# Patient Record
Sex: Male | Born: 1965 | Race: White | Hispanic: No | Marital: Single | Smoking: Never smoker
Health system: Southern US, Community
[De-identification: ages and names within clinical notes are randomized; demographics above are authoritative.]

## PROBLEM LIST (undated history)

## (undated) DIAGNOSIS — G4730 Sleep apnea, unspecified: Secondary | ICD-10-CM

## (undated) DIAGNOSIS — I10 Essential (primary) hypertension: Secondary | ICD-10-CM

## (undated) DIAGNOSIS — R011 Cardiac murmur, unspecified: Secondary | ICD-10-CM

## (undated) DIAGNOSIS — M1990 Unspecified osteoarthritis, unspecified site: Secondary | ICD-10-CM

## (undated) HISTORY — PX: HIP ARTHROPLASTY: SHX981

## (undated) HISTORY — DX: Unspecified osteoarthritis, unspecified site: M19.90

## (undated) HISTORY — DX: Cardiac murmur, unspecified: R01.1

## (undated) HISTORY — DX: Sleep apnea, unspecified: G47.30

---

## 1984-08-15 DIAGNOSIS — R011 Cardiac murmur, unspecified: Secondary | ICD-10-CM

## 1984-08-15 HISTORY — DX: Cardiac murmur, unspecified: R01.1

## 1984-08-15 HISTORY — PX: WISDOM TOOTH EXTRACTION: SHX21

## 1998-02-11 ENCOUNTER — Emergency Department (HOSPITAL_COMMUNITY): Admission: EM | Admit: 1998-02-11 | Discharge: 1998-02-11 | Payer: Self-pay | Admitting: Psychiatry

## 1998-03-27 ENCOUNTER — Ambulatory Visit (HOSPITAL_COMMUNITY): Admission: RE | Admit: 1998-03-27 | Discharge: 1998-03-27 | Payer: Self-pay | Admitting: Family Medicine

## 2002-11-08 ENCOUNTER — Emergency Department (HOSPITAL_COMMUNITY): Admission: EM | Admit: 2002-11-08 | Discharge: 2002-11-08 | Payer: Self-pay | Admitting: Emergency Medicine

## 2005-09-27 ENCOUNTER — Ambulatory Visit (HOSPITAL_BASED_OUTPATIENT_CLINIC_OR_DEPARTMENT_OTHER): Admission: RE | Admit: 2005-09-27 | Discharge: 2005-09-27 | Payer: Self-pay | Admitting: Family Medicine

## 2005-10-02 ENCOUNTER — Ambulatory Visit: Payer: Self-pay | Admitting: Internal Medicine

## 2005-10-26 ENCOUNTER — Ambulatory Visit (HOSPITAL_BASED_OUTPATIENT_CLINIC_OR_DEPARTMENT_OTHER): Admission: RE | Admit: 2005-10-26 | Discharge: 2005-10-26 | Payer: Self-pay | Admitting: Family Medicine

## 2005-11-06 ENCOUNTER — Ambulatory Visit: Payer: Self-pay | Admitting: Internal Medicine

## 2006-05-02 ENCOUNTER — Emergency Department (HOSPITAL_COMMUNITY): Admission: EM | Admit: 2006-05-02 | Discharge: 2006-05-02 | Payer: Self-pay | Admitting: Emergency Medicine

## 2008-09-13 ENCOUNTER — Emergency Department (HOSPITAL_COMMUNITY): Admission: EM | Admit: 2008-09-13 | Discharge: 2008-09-13 | Payer: Self-pay | Admitting: Emergency Medicine

## 2010-05-05 ENCOUNTER — Ambulatory Visit (HOSPITAL_COMMUNITY): Admission: RE | Admit: 2010-05-05 | Discharge: 2010-05-05 | Payer: Self-pay | Admitting: Family Medicine

## 2010-12-31 NOTE — Procedures (Signed)
NAME:  MALE, Corey NO.:  [id number]   MEDICAL RECORD NO.:  [id number]          PATIENT TYPE:  OUT   LOCATION:  SLEEP CENTER                 FACILITY:  [facility]   PHYSICIAN:  Clinton D. Maple Hudson, M.D. DATE OF BIRTH:  Jul 17, 1966   DATE OF STUDY:                              NOCTURNAL POLYSOMNOGRAM   REFERRING PHYSICIAN:  Dr. Noberto Retort.   DATE OF STUDY:  September 27, 2005.   INDICATION FOR STUDY:  Hypersomnia with sleep apnea.   EPWORTH SLEEPINESS SCORE:  4/24.   BMI:  33.8.   WEIGHT:  250 pounds.   HOME MEDICATIONS:  Flonase.   NECK SIZE:  19 inches.   SLEEP ARCHITECTURE:  Total sleep time 303 minutes with sleep efficiency 82%.  Stage I was 7%, stage II 60%, stages III and IV 17%, REM 16% of total sleep  time. Sleep latency 22 minutes, REM latency 174 minutes, awake after sleep  onset 50 minutes, arousal index markedly increased at 72 per hour indicating  severe sleep fragmentation. Sustained sleep onset was not achieved until 1  a.m.   RESPIRATORY DATA:  Apnea/hypopnea index (AHI, RDI) 95.4 obstructive events  per hour indicating severe obstructive sleep apnea/hypopnea syndrome. This  included 2 central apneas, 387 obstructive apneas, 17 mixed apneas and 76  hypopneas. Events were not positional. REM AHI 98.7 per hour. Split protocol  C-PAP titration could not be performed on the study night because of his  delayed sustained sleep onset, leaving insufficient residual time by  protocol.   OXYGEN DATA:  Very loud snoring with oxygen desaturation to a nadir of 54%.  Mean oxygen saturation through the study was 89% on room air.   CARDIAC DATA:  Normal sinus rhythm.   MOVEMENT/PARASOMNIA:  Occasional leg jerk, insignificant. Bathroom x1.   IMPRESSION/RECOMMENDATIONS:  1.  Severe obstructive sleep apnea/hypopnea syndrome, AHI 95.4 per hour with      not positional events, fragmented sleep, loud snoring and oxygen      desaturation to 54%.  2.   Delayed sleep onset prevented split protocol C-PAP titration. Consider      return for C-PAP titration or evaluate sooner for alternative therapies      as appropriate.  3.  Low mean oxygen saturation of 89% on room air may reflect the frequent      apneas. Is there underlying cardiopulmonary disease?      Clinton D. Maple Hudson, M.D.  Diplomate, Biomedical engineer of Sleep Medicine  Electronically Signed     CDY/MEDQ  D:  10/02/2005 11:01:29  T:  10/02/2005 22:15:04  Job:  [id number]

## 2010-12-31 NOTE — Procedures (Signed)
NAME:  Corey Key, Corey Key NO.:  [id number]   MEDICAL RECORD NO.:  [id number]          PATIENT TYPE:  OUT   LOCATION:  SLEEP CENTER                 FACILITY:  [facility]   PHYSICIAN:  Clinton D. Maple Hudson, M.D. DATE OF BIRTH:  [birth year]   DATE OF STUDY:  10/26/2005                              NOCTURNAL POLYSOMNOGRAM   REFERRING PHYSICIAN:  Dr. Nolene Ebbs IV.   DATE OF STUDY:  October 26, 2005.   INDICATION FOR STUDY:  Hypersomnia with sleep apnea.   EPWORTH SLEEPINESS SCORE:  4/24.   BMI:  33.8.   WEIGHT:  250 pounds.   HOME MEDICATIONS:  Flonase.   A baseline diagnostic NPSG on September 27, 2005 reported an AHI of 95.4 per  hour.  C-PAP  titration is requested.   SLEEP ARCHITECTURE:  Total sleep time 399 minutes with sleep efficiency 85%.  Stage I was 3%, stage II 62%, stages III and IV absent, REM 35% of total  sleep time.  Sleep latency 7 minutes, REM latency 108 minutes, awake after  sleep onset 61 minutes, arousal index 8.4.  No bedtime medication was  reported.   RESPIRATORY DATA:  C-PAP titration protocol:  C-PAP was titrated to 16 CWP,  AHI 0 per hour.  A medium ComfortGel mask was used with heated humidifier.   OXYGEN DATA:  Moderate snoring was suppressed at final C-PAP pressure.  Oxygenation was held at 95-98% on room air with C-PAP.   CARDIAC DATA:  Normal sinus rhythm.   MOVEMENT/PARASOMNIA:  Occasional leg jerk, insignificant.   IMPRESSION/RECOMMENDATIONS:  1.  Successful C-PAP titration to 16 CWP, AHI 0 per hour.  A medium      ComfortGel mask was used with heated      humidifier.  2.  Baseline diagnostic NPSG on September 27, 2005 had reported an AHI of      95.4 per hour.      Clinton D. Maple Hudson, M.D.  Diplomate, Biomedical engineer of Sleep Medicine  Electronically Signed     CDY/MEDQ  D:  11/06/2005 11:13:40  T:  11/08/2005 00:16:56  Job:  [id number]

## 2012-12-06 DIAGNOSIS — I10 Essential (primary) hypertension: Secondary | ICD-10-CM | POA: Insufficient documentation

## 2013-07-11 ENCOUNTER — Emergency Department (HOSPITAL_COMMUNITY): Payer: Worker's Compensation

## 2013-07-11 ENCOUNTER — Encounter (HOSPITAL_COMMUNITY): Payer: Self-pay | Admitting: Emergency Medicine

## 2013-07-11 ENCOUNTER — Emergency Department (HOSPITAL_COMMUNITY)
Admission: EM | Admit: 2013-07-11 | Discharge: 2013-07-11 | Disposition: A | Discharge status: Home / Self Care | Payer: Worker's Compensation | Attending: Emergency Medicine | Admitting: Emergency Medicine

## 2013-07-11 DIAGNOSIS — M545 Low back pain, unspecified: Secondary | ICD-10-CM

## 2013-07-11 DIAGNOSIS — S0990XA Unspecified injury of head, initial encounter: Secondary | ICD-10-CM | POA: Diagnosis not present

## 2013-07-11 DIAGNOSIS — S0993XA Unspecified injury of face, initial encounter: Secondary | ICD-10-CM | POA: Diagnosis not present

## 2013-07-11 DIAGNOSIS — R519 Headache, unspecified: Secondary | ICD-10-CM

## 2013-07-11 DIAGNOSIS — I10 Essential (primary) hypertension: Secondary | ICD-10-CM | POA: Insufficient documentation

## 2013-07-11 DIAGNOSIS — Z880 Allergy status to penicillin: Secondary | ICD-10-CM | POA: Insufficient documentation

## 2013-07-11 DIAGNOSIS — IMO0002 Reserved for concepts with insufficient information to code with codable children: Secondary | ICD-10-CM | POA: Insufficient documentation

## 2013-07-11 DIAGNOSIS — R269 Unspecified abnormalities of gait and mobility: Secondary | ICD-10-CM | POA: Diagnosis not present

## 2013-07-11 DIAGNOSIS — R110 Nausea: Secondary | ICD-10-CM | POA: Insufficient documentation

## 2013-07-11 DIAGNOSIS — S1093XA Contusion of unspecified part of neck, initial encounter: Secondary | ICD-10-CM

## 2013-07-11 DIAGNOSIS — S298XXA Other specified injuries of thorax, initial encounter: Secondary | ICD-10-CM | POA: Insufficient documentation

## 2013-07-11 DIAGNOSIS — S0003XA Contusion of scalp, initial encounter: Secondary | ICD-10-CM | POA: Diagnosis not present

## 2013-07-11 DIAGNOSIS — R0781 Pleurodynia: Secondary | ICD-10-CM

## 2013-07-11 DIAGNOSIS — M542 Cervicalgia: Secondary | ICD-10-CM

## 2013-07-11 HISTORY — DX: Essential (primary) hypertension: I10

## 2013-07-11 MED ORDER — OXYCODONE-ACETAMINOPHEN 5-325 MG PO TABS: 1.0000 | ORAL_TABLET | ORAL | Status: DC | PRN

## 2013-07-11 MED ORDER — OXYCODONE-ACETAMINOPHEN 5-325 MG PO TABS
2.0000 | ORAL_TABLET | Freq: Once | ORAL | Status: AC
  Administered 2013-07-11: 2 via ORAL
  Filled 2013-07-11: qty 2

## 2013-07-11 NOTE — ED Notes (Signed)
Patient has ride home with wife 
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
## 2013-07-11 NOTE — ED Notes (Signed)
Dried blood noted on inside of mouth and around lips.
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
## 2013-07-15 NOTE — ED Provider Notes (Signed)
Medical screening examination/treatment/procedure(s) were performed by non-physician practitioner and as supervising physician I was immediately available for consultation/collaboration.  EKG Interpretation   None         Charles B. Bernette Mayers, MD 07/15/13 [phone]

## 2015-01-26 ENCOUNTER — Emergency Department (HOSPITAL_COMMUNITY): Payer: Worker's Compensation

## 2015-01-26 ENCOUNTER — Emergency Department (HOSPITAL_COMMUNITY)
Admission: EM | Admit: 2015-01-26 | Discharge: 2015-01-26 | Disposition: A | Discharge status: Home / Self Care | Payer: Worker's Compensation | Attending: Emergency Medicine | Admitting: Emergency Medicine

## 2015-01-26 ENCOUNTER — Encounter (HOSPITAL_COMMUNITY): Payer: Self-pay | Admitting: Emergency Medicine

## 2015-01-26 DIAGNOSIS — Y990 Civilian activity done for income or pay: Secondary | ICD-10-CM | POA: Diagnosis not present

## 2015-01-26 DIAGNOSIS — W228XXA Striking against or struck by other objects, initial encounter: Secondary | ICD-10-CM | POA: Insufficient documentation

## 2015-01-26 DIAGNOSIS — I10 Essential (primary) hypertension: Secondary | ICD-10-CM | POA: Insufficient documentation

## 2015-01-26 DIAGNOSIS — S6992XA Unspecified injury of left wrist, hand and finger(s), initial encounter: Secondary | ICD-10-CM | POA: Diagnosis present

## 2015-01-26 DIAGNOSIS — Z880 Allergy status to penicillin: Secondary | ICD-10-CM | POA: Diagnosis not present

## 2015-01-26 DIAGNOSIS — S63502A Unspecified sprain of left wrist, initial encounter: Secondary | ICD-10-CM | POA: Insufficient documentation

## 2015-01-26 DIAGNOSIS — Y9389 Activity, other specified: Secondary | ICD-10-CM | POA: Insufficient documentation

## 2015-01-26 DIAGNOSIS — Y9289 Other specified places as the place of occurrence of the external cause: Secondary | ICD-10-CM | POA: Diagnosis not present

## 2015-01-26 MED ORDER — NAPROXEN 250 MG PO TABS: 250.0000 mg | ORAL_TABLET | Freq: Two times a day (BID) | ORAL | Status: DC

## 2015-01-26 MED ORDER — IBUPROFEN 400 MG PO TABS
800.0000 mg | ORAL_TABLET | Freq: Once | ORAL | Status: AC
  Administered 2015-01-26: 800 mg via ORAL
  Filled 2015-01-26: qty 2

## 2015-01-26 NOTE — ED Notes (Signed)
Pt states this is a worker's comp claim, but he verified with his employer that he did not need a drug screen.

## 2015-01-26 NOTE — ED Notes (Signed)
Pt verbalizes understanding of d/c instructions and denies any further needs at this time. 
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
## 2015-01-26 NOTE — Discharge Instructions (Signed)
Wrist Pain Wrist injuries are frequent in adults and children. A sprain is an injury to the ligaments that hold your bones together. A strain is an injury to muscle or muscle cord-like structures (tendons) from stretching or pulling. Generally, when wrists are moderately tender to touch following a fall or injury, a break in the bone (fracture) may be present. Most wrist sprains or strains are better in 3 to 5 days, but complete healing may take several weeks. HOME CARE INSTRUCTIONS   Put ice on the injured area.  Put ice in a plastic bag.  Place a towel between your skin and the bag.  Leave the ice on for 15-20 minutes, 3-4 times a day, for the first 2 days, or as directed by your health care provider.  Keep your arm raised above the level of your heart whenever possible to reduce swelling and pain.  Rest the injured area for at least 48 hours or as directed by your health care provider.  If a splint or elastic bandage has been applied, use it for as long as directed by your health care provider or until seen by a health care provider for a follow-up exam.  Only take over-the-counter or prescription medicines for pain, discomfort, or fever as directed by your health care provider.  Keep all follow-up appointments. You may need to follow up with a specialist or have follow-up X-rays. Improvement in pain level is not a guarantee that you did not fracture a bone in your wrist. The only way to determine whether or not you have a broken bone is by X-ray. SEEK IMMEDIATE MEDICAL CARE IF:   Your fingers are swollen, very red, white, or cold and blue.  Your fingers are numb or tingling.  You have increasing pain.  You have difficulty moving your fingers. MAKE SURE YOU:   Understand these instructions.  Will watch your condition.  Will get help right away if you are not doing well or get worse. Document Released: 05/11/2005 Document Revised: 08/06/2013 Document Reviewed:  09/22/2010 [facility] Patient Information 2015 [place], [place]. This information is not intended to replace advice given to you by your health care provider. Make sure you discuss any questions you have with your health care provider. Wrist Splint A wrist splint is a brace that holds your wrist in a fixed position. It can be used to stabilize your wrist so that broken bones and sprains can heal faster, with less pain. It can also help to relieve pressure on the nerve that runs down the middle of your arm (median nerve). Splints are available in drugstores without a prescription. They are also available by prescription from orthopedic and medical supply stores. Custom splints made from lightweight materials can be made by physical or occupational therapist. [place] your splint as instructed by your caregiver. It may be worn while you sleep.  Your caregiver may instruct you how to perform certain exercises at home. These exercises help maintain muscle strength in your hand and wrist. They also help to maintain motion in your fingers. SEEK MEDICAL CARE IF:  You start to lose feeling in your hand or fingers.  Your skin or fingernails turn blue or gray, or they feel cold. MAKE SURE YOU:   Understand these instructions.  Will watch your condition.  Will get help right away if you are not doing well or get worse. Document Released: 07/14/2006 Document Revised: 10/24/2011 Document Reviewed: 11/12/2013 [facility] Patient Information 2015 [place], [place]. This information is  not intended to replace advice given to you by your health care provider. Make sure you discuss any questions you have with your health care provider.

## 2015-01-26 NOTE — ED Notes (Signed)
Pt. 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

## 2015-12-30 ENCOUNTER — Encounter: Payer: Self-pay | Admitting: Gastroenterology

## 2016-01-29 ENCOUNTER — Ambulatory Visit (AMBULATORY_SURGERY_CENTER): Payer: Self-pay

## 2016-01-29 VITALS — Ht 72.0 in | Wt 272.6 lb

## 2016-01-29 DIAGNOSIS — Z83719 Family history of colon polyps, unspecified: Secondary | ICD-10-CM

## 2016-01-29 DIAGNOSIS — Z8371 Family history of colonic polyps: Secondary | ICD-10-CM

## 2016-01-29 MED ORDER — SUPREP BOWEL PREP KIT 17.5-3.13-1.6 GM/177ML PO SOLN: 1.0000 | Freq: Once | ORAL | Status: DC

## 2016-01-29 NOTE — Progress Notes (Signed)
No allergies to eggs or soy No diet meds No home oxygen but does use CPAP No past problems with anesthesia  Has email and internet; registered for emmi

## 2016-02-09 ENCOUNTER — Encounter (HOSPITAL_BASED_OUTPATIENT_CLINIC_OR_DEPARTMENT_OTHER): Payer: Self-pay

## 2016-02-09 ENCOUNTER — Emergency Department (HOSPITAL_BASED_OUTPATIENT_CLINIC_OR_DEPARTMENT_OTHER): Payer: Worker's Compensation

## 2016-02-09 ENCOUNTER — Emergency Department (HOSPITAL_BASED_OUTPATIENT_CLINIC_OR_DEPARTMENT_OTHER)
Admission: EM | Admit: 2016-02-09 | Discharge: 2016-02-09 | Disposition: A | Discharge status: Home / Self Care | Payer: Worker's Compensation | Attending: Emergency Medicine | Admitting: Emergency Medicine

## 2016-02-09 DIAGNOSIS — I10 Essential (primary) hypertension: Secondary | ICD-10-CM | POA: Diagnosis not present

## 2016-02-09 DIAGNOSIS — Y990 Civilian activity done for income or pay: Secondary | ICD-10-CM | POA: Insufficient documentation

## 2016-02-09 DIAGNOSIS — M79641 Pain in right hand: Secondary | ICD-10-CM

## 2016-02-09 DIAGNOSIS — S6991XA Unspecified injury of right wrist, hand and finger(s), initial encounter: Secondary | ICD-10-CM | POA: Insufficient documentation

## 2016-02-09 DIAGNOSIS — Y9389 Activity, other specified: Secondary | ICD-10-CM | POA: Diagnosis not present

## 2016-02-09 DIAGNOSIS — M1990 Unspecified osteoarthritis, unspecified site: Secondary | ICD-10-CM | POA: Insufficient documentation

## 2016-02-09 DIAGNOSIS — Y9289 Other specified places as the place of occurrence of the external cause: Secondary | ICD-10-CM | POA: Diagnosis not present

## 2016-02-09 MED ORDER — IBUPROFEN 800 MG PO TABS
800.0000 mg | ORAL_TABLET | Freq: Once | ORAL | Status: AC
  Administered 2016-02-09: 800 mg via ORAL
  Filled 2016-02-09: qty 1

## 2016-02-09 MED ORDER — IBUPROFEN 800 MG PO TABS: 800.0000 mg | ORAL_TABLET | Freq: Three times a day (TID) | ORAL | Status: AC

## 2016-02-09 NOTE — ED Provider Notes (Signed)
CSN: [id number]     Arrival date & time 02/09/16  [id number] History   First MD Initiated Contact with Patient 02/09/16 2146     Chief Complaint  Patient presents with  . Hand Injury    (Consider location/radiation/quality/duration/timing/severity/associated sxs/prior Treatment) Patient is a 50 y.o. male presenting with hand injury. The history is provided by the patient and medical records. No language interpreter was used.  Hand Injury    Corey Key is a 50 y.o. male  who presents to the Emergency Department complaining of acute onset of right hand pain near middle finger knuckle since an injury at work today. 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 Patient is unsure of the mechanism of injury but believes his finger bent forward oddly. Admits to swelling. Denies numbness, tingling. Pain worse with flexion and extension of fingers. No medications taken PTA fo symptoms.    Past Medical History  Diagnosis Date  . Hypertension   . Arthritis     left hip  . Heart murmur 1986  . Sleep apnea     CPAP   Past Surgical History  Procedure Laterality Date  . Wisdom tooth extraction  1986   Family History  Problem Relation Age of Onset  . Colon polyps Father   . Prostate cancer Father   . Colon cancer Neg Hx    Social History  Substance Use Topics  . Smoking status: Never Smoker   . Smokeless tobacco: Never Used  . Alcohol Use: 0.0 oz/week    0 Standard drinks or equivalent per week     Comment: rarely    Review of Systems  Musculoskeletal: Positive for arthralgias.  Skin: Negative for color change.  Neurological: Negative for numbness.      Allergies  Penicillins  Home Medications   Prior to Admission medications   Medication Sig Start Date End Date Taking? Authorizing Provider  ibuprofen (ADVIL,MOTRIN) 800 MG tablet Take 1 tablet (800 mg total) by mouth 3 (three) times daily. 02/09/16   Linnae Rasool Pilcher Jhonatan Lomeli,  PA-C   BP 141/98 mmHg  Pulse 94  Resp 16  SpO2 97% Physical Exam  Constitutional: He is oriented to person, place, and time. He appears well-developed and well-nourished.  Alert and in no acute distress  HENT:  Head: Normocephalic and atraumatic.  Cardiovascular: Normal rate, regular rhythm and normal heart sounds.  Exam reveals no gallop and no friction rub.   No murmur heard. Pulmonary/Chest: Effort normal and breath sounds normal. No respiratory distress. He has no wheezes. He has no rales.  Musculoskeletal: He exhibits no edema.  Right hand with no gross deformity noted. Patient has full ROM. TTP over middle finger knuckle area with mild swelling appreciated. No erythema or warmth overlaying the joint.The patient has normal sensation and motor function in the median, ulnar, and radial nerve distributions. There is no anatomic snuff box tenderness. The patient has normal active and passive range of motion of their digits. 2+ radial pulse.  Neurological: He is alert and oriented to person, place, and time.  Skin: Skin is warm and dry.  Nursing note and vitals reviewed.   ED Course  Procedures (including critical care time) Labs Review Labs Reviewed - No data to display  Imaging Review Dg Hand Complete Right  02/09/2016  CLINICAL DATA:  51 year old male with injury to right hand EXAM: RIGHT HAND - COMPLETE 3+ VIEW COMPARISON:  None. FINDINGS: There is no  evidence of fracture or dislocation. There is no evidence of arthropathy or other focal bone abnormality. Soft tissues are unremarkable. IMPRESSION: Negative. Electronically Signed   By: Anner Crete M.D.   On: 02/09/2016 19:27   I have personally reviewed and evaluated these images and lab results as part of my medical decision-making.   EKG Interpretation None      MDM   Final diagnoses:  Hand pain, right   Corey Key presents to ED for right hand pain after injury today. X-rays were obtained which were negative.  On exam, RUE is NVI with no open wounds. Pain is of middle finger knuckle area. Finger splint applied by tech for comfort. Symptomatic home care instructions discussed. Follow-up in one week if no improvement in symptoms. Return precautions discussed and all questions answered.   [facility] Murlene Revell, PA-C 02/09/16 [place], DO 02/09/16 [phone]

## 2016-02-09 NOTE — ED Notes (Signed)
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

## 2016-02-09 NOTE — Discharge Instructions (Signed)
1. Medications: Ibuprofen as needed for pain, continue usual home medications 2. Treatment: Ice affected area as needed for pain.  3. Follow Up: Please follow up with your primary doctor or orthopedist in one week if symptoms not improving.  Please return to the ER for new or worsening symptoms, any additional concerns.   COLD THERAPY DIRECTIONS:  Ice or gel packs can be used to reduce both pain and swelling. Ice is the most helpful within the first 24 to 48 hours after an injury or flareup from overusing a muscle or joint.  Ice is effective, has very few side effects, and is safe for most people to use.   If you expose your skin to cold temperatures for too long or without the proper protection, you can damage your skin or nerves. Watch for signs of skin damage due to cold.   HOME CARE INSTRUCTIONS  Follow these tips to use ice and cold packs safely.  Place a dry or damp towel between the ice and skin. A damp towel will cool the skin more quickly, so you may need to shorten the time that the ice is used.  For a more rapid response, add gentle compression to the ice.  Ice for no more than 10 to 20 minutes at a time. The bonier the area you are icing, the less time it will take to get the benefits of ice.  Check your skin after 5 minutes to make sure there are no signs of a poor response to cold or skin damage.  Rest 20 minutes or more in between uses.  Once your skin is numb, you can end your treatment. You can test numbness by very lightly touching your skin. The touch should be so light that you do not see the skin dimple from the pressure of your fingertip. When using ice, most people will feel these normal sensations in this order: cold, burning, aching, and numbness.

## 2016-02-16 ENCOUNTER — Encounter: Payer: Self-pay | Admitting: Gastroenterology

## 2016-02-19 ENCOUNTER — Encounter: Payer: Self-pay | Admitting: Gastroenterology

## 2016-03-01 ENCOUNTER — Encounter: Payer: Self-pay | Admitting: Gastroenterology

## 2016-03-01 ENCOUNTER — Ambulatory Visit (AMBULATORY_SURGERY_CENTER): Payer: 59 | Admitting: Gastroenterology

## 2016-03-01 VITALS — BP 112/65 | HR 77 | Temp 98.6°F | Resp 14 | Ht 72.0 in | Wt 272.0 lb

## 2016-03-01 DIAGNOSIS — Z1211 Encounter for screening for malignant neoplasm of colon: Secondary | ICD-10-CM

## 2016-03-01 DIAGNOSIS — K621 Rectal polyp: Secondary | ICD-10-CM | POA: Diagnosis not present

## 2016-03-01 DIAGNOSIS — D128 Benign neoplasm of rectum: Secondary | ICD-10-CM

## 2016-03-01 DIAGNOSIS — Z8371 Family history of colonic polyps: Secondary | ICD-10-CM

## 2016-03-01 MED ORDER — SODIUM CHLORIDE 0.9 % IV SOLN: 500.0000 mL | INTRAVENOUS | Status: DC

## 2016-03-01 NOTE — Progress Notes (Signed)
No problems noted in the recovery room. maw 

## 2016-03-01 NOTE — Op Note (Signed)
[place] Patient Name: Corey Key Procedure Date: 03/01/2016 1:52 PM MRN: [id number] Endoscopist: Remo Lipps P. Havery Moros , MD Age: 50 Referring MD:  Date of Birth: 12-May-1966 Gender: Male Account #: [id number] Procedure:                Colonoscopy Indications:              Screening for malignant neoplasm in the colon, This                            is the patient's first colonoscopy Medicines:                Monitored Anesthesia Care Procedure:                Pre-Anesthesia Assessment:                           - Prior to the procedure, a History and Physical                            was performed, and patient medications and                            allergies were reviewed. The patient's tolerance of                            previous anesthesia was also reviewed. The risks                            and benefits of the procedure and the sedation                            options and risks were discussed with the patient.                            All questions were answered, and informed consent                            was obtained. Prior Anticoagulants: The patient has                            taken no previous anticoagulant or antiplatelet                            agents. ASA Grade Assessment: II - A patient with                            mild systemic disease. After reviewing the risks                            and benefits, the patient was deemed in                            satisfactory condition to undergo the procedure.  After obtaining informed consent, the colonoscope                            was passed under direct vision. Throughout the                            procedure, the patient's blood pressure, pulse, and                            oxygen saturations were monitored continuously. The                            Model CF-HQ190L [id number]) scope was introduced                            through the anus  and advanced to the the cecum,                            identified by appendiceal orifice and ileocecal                            valve. The colonoscopy was performed without                            difficulty. The patient tolerated the procedure                            well. The quality of the bowel preparation was                            good. The ileocecal valve, appendiceal orifice, and                            rectum were photographed. Scope In: 2:01:38 PM Scope Out: 2:17:57 PM Scope Withdrawal Time: 0 hours 14 minutes 50 seconds  Total Procedure Duration: 0 hours 16 minutes 19 seconds  Findings:                 The perianal and digital rectal examinations were                            normal.                           A 3 mm polyp was found in the rectum. The polyp was                            sessile. The polyp was removed with a cold snare.                            Resection and retrieval were complete.                           Many medium-mouthed diverticula were found in the  left colon.                           Non-bleeding internal hemorrhoids were found during                            retroflexion.                           The exam was otherwise without abnormality. Complications:            No immediate complications. Estimated blood loss:                            Minimal. Estimated Blood Loss:     Estimated blood loss was minimal. Impression:               - One 3 mm polyp in the rectum, removed with a cold                            snare. Resected and retrieved.                           - Diverticulosis in the left colon.                           - Non-bleeding internal hemorrhoids.                           - The examination was otherwise normal. Recommendation:           - Patient has a contact number available for                            emergencies. The signs and symptoms of potential                             delayed complications were discussed with the                            patient. Return to normal activities tomorrow.                            Written discharge instructions were provided to the                            patient.                           - Resume previous diet.                           - Continue present medications.                           - No aspirin, ibuprofen, naproxen, or other  non-steroidal anti-inflammatory drugs for 2 weeks                            after polyp removal.                           - Await pathology results.                           - Repeat colonoscopy is recommended for                            surveillance. The colonoscopy date will be                            determined after pathology results from today's                            exam become available for review. Remo Lipps P. Averiana Clouatre, MD 03/01/2016 2:22:34 PM This report has been signed electronically.

## 2016-03-01 NOTE — Progress Notes (Signed)
Called to room to assist during endoscopic procedure.  Patient ID and intended procedure confirmed with present staff. Received instructions for my participation in the procedure from the performing physician.  

## 2016-03-01 NOTE — Patient Instructions (Signed)
YOU HAD AN ENDOSCOPIC PROCEDURE TODAY AT [facility]:   Refer to the procedure report that was given to you for any specific questions about what was found during the examination.  If the procedure report does not answer your questions, please call your gastroenterologist to clarify.  If you requested that your care partner not be given the details of your procedure findings, then the procedure report has been included in a sealed envelope for you to review at your convenience later.  YOU SHOULD EXPECT: Some feelings of bloating in the abdomen. Passage of more gas than usual.  Walking can help get rid of the air that was put into your GI tract during the procedure and reduce the bloating. If you had a lower endoscopy (such as a colonoscopy or flexible sigmoidoscopy) you may notice spotting of blood in your stool or on the toilet paper. If you underwent a bowel prep for your procedure, you may not have a normal bowel movement for a few days.  Please Note:  You might notice some irritation and congestion in your nose or some drainage.  This is from the oxygen used during your procedure.  There is no need for concern and it should clear up in a day or so.  SYMPTOMS TO REPORT IMMEDIATELY:   Following lower endoscopy (colonoscopy or flexible sigmoidoscopy):  Excessive amounts of blood in the stool  Significant tenderness or worsening of abdominal pains  Swelling of the abdomen that is new, acute  Fever of 100F or higher   For urgent or emergent issues, a gastroenterologist can be reached at any hour by calling [phone].   DIET: Your first meal following the procedure should be a small meal and then it is ok to progress to your normal diet. Heavy or fried foods are harder to digest and may make you feel nauseous or bloated.  Likewise, meals heavy in dairy and vegetables can increase bloating.  Drink plenty of fluids but you should avoid alcoholic beverages for 24  hours.  ACTIVITY:  You should plan to take it easy for the rest of today and you should NOT DRIVE or use heavy machinery until tomorrow (because of the sedation medicines used during the test).    FOLLOW UP: Our staff will call the number listed on your records the next business day following your procedure to check on you and address any questions or concerns that you may have regarding the information given to you following your procedure. If we do not reach you, we will leave a message.  However, if you are feeling well and you are not experiencing any problems, there is no need to return our call.  We will assume that you have returned to your regular daily activities without incident.  If any biopsies were taken you will be contacted by phone or by letter within the next 1-3 weeks.  Please call us at [phone] if you have not heard about the biopsies in 3 weeks.    SIGNATURES/CONFIDENTIALITY: You and/or your care partner have signed paperwork which will be entered into your electronic medical record.  These signatures attest to the fact that that the information above on your After Visit Summary has been reviewed and is understood.  Full responsibility of the confidentiality of this discharge information lies with you and/or your care-partner.    Handouts were given to your care partner on polyps, diverticulosis, and hemorrhoids. No aspirin, aspirin products,  ibuprofen, naproxen, advil, motrin, aleve,  or other non-steroidal anti-inflammatory drugs for 14 days after polyp removal. You may resume your other current medications today. Await biopsy results. Please call if any questions or concerns.

## 2016-03-01 NOTE — Progress Notes (Signed)
Stable to RR 

## 2016-03-02 ENCOUNTER — Telehealth: Payer: Self-pay | Admitting: *Deleted

## 2016-03-02 NOTE — Telephone Encounter (Signed)
Left message that [place] endo called.

## 2016-03-03 ENCOUNTER — Encounter: Payer: Self-pay | Admitting: Gastroenterology

## 2017-06-02 ENCOUNTER — Emergency Department (HOSPITAL_BASED_OUTPATIENT_CLINIC_OR_DEPARTMENT_OTHER)
Admission: EM | Admit: 2017-06-02 | Discharge: 2017-06-02 | Disposition: A | Discharge status: Home / Self Care | Payer: Worker's Compensation | Attending: Emergency Medicine | Admitting: Emergency Medicine

## 2017-06-02 ENCOUNTER — Encounter (HOSPITAL_BASED_OUTPATIENT_CLINIC_OR_DEPARTMENT_OTHER): Payer: Self-pay | Admitting: Emergency Medicine

## 2017-06-02 DIAGNOSIS — I10 Essential (primary) hypertension: Secondary | ICD-10-CM | POA: Insufficient documentation

## 2017-06-02 DIAGNOSIS — Y999 Unspecified external cause status: Secondary | ICD-10-CM | POA: Diagnosis not present

## 2017-06-02 DIAGNOSIS — S0003XA Contusion of scalp, initial encounter: Secondary | ICD-10-CM | POA: Diagnosis not present

## 2017-06-02 DIAGNOSIS — S0990XA Unspecified injury of head, initial encounter: Secondary | ICD-10-CM

## 2017-06-02 DIAGNOSIS — Y929 Unspecified place or not applicable: Secondary | ICD-10-CM | POA: Diagnosis not present

## 2017-06-02 DIAGNOSIS — Y939 Activity, unspecified: Secondary | ICD-10-CM | POA: Diagnosis not present
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
## 2017-06-02 NOTE — Discharge Instructions (Addendum)
Your exam was reassuring. Take aleve as needed for your headache. Follow up with your PCP in 1 week for re-evaluation.   Return instructions:  Please return to the Emergency Department if you do not get better, if you get worse, or new symptoms OR If you develop severe headaches, disequilibrium/difficulty walking, double vision, difficulty concentrating, sensitivity to light, changes in mood, nausea/vomiting, ongoing dizziness you can return for re-evaluation. Please return if you have any other emergent concerns.  Additional Information:  Your vital signs today were: BP (!) 153/102 (BP Location: Left Arm)    Pulse (!) 108    Temp 98.6 F (37 C) (Oral)    Resp 18    Ht 5\' 11"  (1.803 m)    Wt 120.2 kg (265 lb)    SpO2 100%    BMI 36.96 kg/m  If your blood pressure (BP) was elevated above 135/85 this visit, please have this repeated by your doctor within one month. ---------------

## 2018-01-23 DIAGNOSIS — Z96642 Presence of left artificial hip joint: Secondary | ICD-10-CM | POA: Insufficient documentation

## 2018-02-28 ENCOUNTER — Ambulatory Visit: Payer: 59 | Admitting: Podiatry

## 2018-02-28 ENCOUNTER — Other Ambulatory Visit: Payer: Self-pay | Admitting: Podiatry

## 2018-02-28 ENCOUNTER — Ambulatory Visit (INDEPENDENT_AMBULATORY_CARE_PROVIDER_SITE_OTHER): Payer: 59

## 2018-02-28 ENCOUNTER — Encounter: Payer: Self-pay | Admitting: Podiatry

## 2018-02-28 ENCOUNTER — Other Ambulatory Visit: Payer: Self-pay

## 2018-02-28 VITALS — BP 149/98 | HR 81

## 2018-02-28 DIAGNOSIS — M2041 Other hammer toe(s) (acquired), right foot: Secondary | ICD-10-CM

## 2018-02-28 DIAGNOSIS — E669 Obesity, unspecified: Secondary | ICD-10-CM | POA: Insufficient documentation

## 2018-02-28 DIAGNOSIS — M79671 Pain in right foot: Secondary | ICD-10-CM

## 2018-02-28 DIAGNOSIS — M1990 Unspecified osteoarthritis, unspecified site: Secondary | ICD-10-CM | POA: Insufficient documentation

## 2018-02-28 NOTE — Progress Notes (Signed)
Subjective:   Patient ID: Corey Key, male   DOB: 52 y.o.   MRN: [id number]   HPI Patient presents stating that he is been having problems with this second digit on his right foot and he did have his hip replaced in June   ROS      Objective:  Physical Exam  Neurovascular status intact with patient's right second digit showing some distal malalignment but no indications of significant pain in either the inner phalangeal joint or the metatarsal phalangeal joint second digit right     Assessment:  Possibility for flexor plate condition early stage or possibility for cramps secondary to change in gait from the pain in his hip or the possibility of other unknown condition     Plan:  H&P x-ray reviewed condition discussed at great length.  At this point I do not recommend treatment unless symptoms were to get worse and I did explain to the patient what to watch for and we will consider bracing if it were to get worse but at this point the x-ray shows the toe has not lifted in the sagittal plane and should be okay  X-ray was negative for signs of what appears to be flexor plate dislocation right second metatarsal phalangeal joint with no other significant pathology noted

## 2019-11-08 ENCOUNTER — Other Ambulatory Visit: Payer: Self-pay

## 2019-11-08 DIAGNOSIS — R072 Precordial pain: Secondary | ICD-10-CM | POA: Diagnosis not present

## 2019-11-08 DIAGNOSIS — R0789 Other chest pain: Secondary | ICD-10-CM | POA: Diagnosis present

## 2019-11-08 DIAGNOSIS — Z79899 Other long term (current) drug therapy: Secondary | ICD-10-CM | POA: Diagnosis not present

## 2019-11-08 DIAGNOSIS — I10 Essential (primary) hypertension: Secondary | ICD-10-CM | POA: Diagnosis not present

## 2019-11-08 MED ORDER — SODIUM CHLORIDE 0.9% FLUSH
3.0000 mL | Freq: Once | INTRAVENOUS | Status: AC
  Administered 2019-11-09: 3 mL via INTRAVENOUS

## 2019-11-09 ENCOUNTER — Emergency Department (HOSPITAL_COMMUNITY): Payer: Managed Care, Other (non HMO)

## 2019-11-09 ENCOUNTER — Emergency Department (HOSPITAL_COMMUNITY)
Admission: EM | Admit: 2019-11-09 | Discharge: 2019-11-09 | Disposition: A | Discharge status: Home / Self Care | Payer: Managed Care, Other (non HMO) | Attending: Emergency Medicine | Admitting: Emergency Medicine

## 2019-11-09 ENCOUNTER — Encounter (HOSPITAL_COMMUNITY): Payer: Self-pay | Admitting: Emergency Medicine

## 2019-11-09 ENCOUNTER — Other Ambulatory Visit: Payer: Self-pay

## 2019-11-09 DIAGNOSIS — R072 Precordial pain: Secondary | ICD-10-CM

## 2019-11-09 LAB — BASIC METABOLIC PANEL
Anion gap: 8 (ref 5–15)
BUN: 21 mg/dL — ABNORMAL HIGH (ref 6–20)
CO2: 28 mmol/L (ref 22–32)
Calcium: 9.4 mg/dL (ref 8.9–10.3)
Chloride: 105 mmol/L (ref 98–111)
Creatinine, Ser: 1.21 mg/dL (ref 0.61–1.24)
GFR calc Af Amer: >60 mL/min (ref >60)
GFR calc non Af Amer: >60 mL/min (ref >60)
Glucose, Bld: 213 mg/dL — ABNORMAL HIGH (ref 70–99)
Potassium: 4.4 mmol/L (ref 3.5–5.1)
Sodium: 141 mmol/L (ref 135–145)

## 2019-11-09 LAB — CBC
HCT: 46.3 % (ref 39.0–52.0)
Hemoglobin: 15.5 g/dL (ref 13.0–17.0)
MCH: 30 pg (ref 26.0–34.0)
MCHC: 33.5 g/dL (ref 30.0–36.0)
MCV: 89.7 fL (ref 80.0–100.0)
Platelets: 274 10*3/uL (ref 150–400)
RBC: 5.16 MIL/uL (ref 4.22–5.81)
RDW: 12.9 % (ref 11.5–15.5)
WBC: 9.1 10*3/uL (ref 4.0–10.5)
nRBC: 0 % (ref 0.0–0.2)

## 2019-11-09 LAB — TROPONIN I (HIGH SENSITIVITY)
Troponin I (High Sensitivity): 4 ng/L (ref <18)
Troponin I (High Sensitivity): 4 ng/L (ref <18)

## 2019-11-09 MED ORDER — ASPIRIN 81 MG PO CHEW
324.0000 mg | CHEWABLE_TABLET | Freq: Once | ORAL | Status: AC
  Administered 2019-11-09: 01:00:00 324 mg via ORAL
  Filled 2019-11-09: qty 4

## 2019-11-09 NOTE — Discharge Instructions (Addendum)

## 2019-11-09 NOTE — ED Triage Notes (Signed)
Patient is complaining of pressure and tightness mid chest that started 45 min ago.

## 2019-11-09 NOTE — ED Provider Notes (Signed)
[facility] DEPT Provider Note   CSN: [id number] Arrival date & time: 11/08/19  2350     History Chief Complaint  Patient presents with  . Chest Pain    Corey Key is a 54 y.o. male.  The history is provided by the patient.  Chest Pain Pain location:  Substernal area and L chest Pain quality: pressure   Pain radiates to:  L arm Pain severity:  Moderate Onset quality:  Sudden Duration:  45 minutes Timing:  Constant Progression:  Improving Chronicity:  New Context: at rest   Relieved by:  Nothing Worsened by:  Nothing Ineffective treatments:  Antacids Associated symptoms: diaphoresis and nausea   Associated symptoms: no abdominal pain, no back pain, no cough, no fever, no lower extremity edema, no shortness of breath, no syncope and no vomiting   Risk factors: diabetes mellitus, hypertension, male sex and obesity   Risk factors: no coronary artery disease and no smoking    Patient reports onset of chest pain at rest.  It starts in the left chest into his left arm.  No focal weakness.  No radiation to the back.  No tearing sensation into his back. He has never had this before.  He is a non-smoker. No pleuritic pain    Past Medical History:  Diagnosis Date  . Arthritis    left hip  . Heart murmur 1986  . Hypertension   . Sleep apnea    CPAP    Patient Active Problem List   Diagnosis Date Noted  . Osteoarthritis 02/28/2018  . Obesity 02/28/2018  . Status post total hip replacement, left 01/23/2018  . HTN (hypertension) 12/06/2012    Past Surgical History:  Procedure Laterality Date  . WISDOM TOOTH EXTRACTION  1986       Family History  Problem Relation Age of Onset  . Colon polyps Father   . Prostate cancer Father   . Colon cancer Neg Hx     Social History   Tobacco Use  . Smoking status: Never Smoker  . Smokeless tobacco: Never Used  Substance Use Topics  . Alcohol use: Yes    Alcohol/week: 0.0 standard  drinks    Comment: rarely  . Drug use: No    Home Medications Prior to Admission medications   Medication Sig Start Date End Date Taking? Authorizing Provider  acetaminophen (TYLENOL) 325 MG tablet Take by mouth. 01/23/18   [provider]  cephALEXin (KEFLEX) 500 MG capsule TAKE ONE CAPSULE BY MOUTH 4 (FOUR) TIMES DAILY FOR 3 DAYS. 01/23/18   [provider]  ibuprofen (ADVIL,MOTRIN) 800 MG tablet Take 1 tablet (800 mg total) by mouth 3 (three) times daily. 02/09/16   Ward, Ozella Almond, PA-C  lisinopril-hydrochlorothiazide (ZESTORETIC) 10-12.5 MG tablet Take by mouth. 10/11/17   [provider]  methocarbamol (ROBAXIN) 500 MG tablet TAKE ONE TABLET BY MOUTH 3 (THREE) TIMES A DAY AS NEEDED. 01/23/18   [provider]  oxyCODONE (OXY IR/ROXICODONE) 5 MG immediate release tablet TAKE ONE TABLET BY MOUTH EVERY 4 HOURS AS NEEDED FOR PAIN FOR UP TO 10 DAYS. 01/23/18   [provider]  polyethylene glycol (MIRALAX / GLYCOLAX) packet Take by mouth. 01/23/18   [provider]    Allergies    Penicillins  Review of Systems   Review of Systems  Constitutional: Positive for diaphoresis. Negative for fever.  Respiratory: Negative for cough and shortness of breath.   Cardiovascular: Positive for chest pain. Negative for leg  swelling and syncope.  Gastrointestinal: Positive for nausea. Negative for abdominal pain and vomiting.  Musculoskeletal: Negative for back pain.  All other systems reviewed and are negative.   Physical Exam Updated Vital Signs BP (!) 170/113 (BP Location: Left Arm)   Pulse (!) 105   Temp 98.3 F (36.8 C)   Ht 1.829 m (6')   Wt 120.2 kg   SpO2 96%   BMI 35.94 kg/m   Physical Exam CONSTITUTIONAL: Well developed/well nourished HEAD: Normocephalic/atraumatic EYES: EOMI/PERRL ENMT: Mucous membranes moist NECK: supple no meningeal signs SPINE/BACK:entire spine nontender CV: S1/S2 noted, no murmurs/rubs/gallops  noted LUNGS: Lungs are clear to auscultation bilaterally, no apparent distress ABDOMEN: soft, nontender, no rebound or guarding, bowel sounds noted throughout abdomen GU:no cva tenderness NEURO: Pt is awake/alert/appropriate, moves all extremitiesx4.  No facial droop.   EXTREMITIES: pulses normal/equalx4, full ROM, no lower extremity edema or tenderness SKIN: warm, color normal PSYCH: no abnormalities of mood noted, alert and oriented to situation  ED Results / Procedures / Treatments   Labs (all labs ordered are listed, but only abnormal results are displayed) Labs Reviewed  BASIC METABOLIC PANEL - Abnormal; Notable for the following components:      Result Value   Glucose, Bld 213 (*)    BUN 21 (*)    All other components within normal limits  CBC  TROPONIN I (HIGH SENSITIVITY)  TROPONIN I (HIGH SENSITIVITY)    EKG EKG Interpretation  Date/Time:  Friday November 08 2019 23:56:21 EDT Ventricular Rate:  104 PR Interval:    QRS Duration: 104 QT Interval:  334 QTC Calculation: 440 R Axis:   -16 Text Interpretation: Sinus tachycardia Borderline left axis deviation Low voltage, precordial leads No significant change since last tracing Confirmed by Ripley Fraise [phone]) on 11/08/2019 11:59:41 PM   Radiology DG Chest 2 View  Result Date: 11/09/2019 CLINICAL DATA:  Chest pain EXAM: CHEST - 2 VIEW COMPARISON:  July 11, 2013 FINDINGS: The heart size and mediastinal contours are within normal limits. Tortuous descending aorta. Both lungs are clear. The visualized skeletal structures are unremarkable. IMPRESSION: No active cardiopulmonary disease. Electronically Signed   By: Prudencio Pair M.D.   On: 11/09/2019 00:25    Procedures Procedures  Medications Ordered in ED Medications  sodium chloride flush (NS) 0.9 % injection 3 mL (3 mLs Intravenous Given 11/09/19 0114)  aspirin chewable tablet 324 mg (324 mg Oral Given 11/09/19 0113)    ED Course  I have reviewed the triage vital  signs and the nursing notes.  Pertinent labs & imaging results that were available during my care of the patient were reviewed by me and considered in my medical decision making (see chart for details).    MDM Rules/Calculators/A&P         HEART Score: 4             Patient stable.  Initial troponin unremarkable. Will monitor in the ED   Patient monitored in the ER for several hours without any significant chest pain.  He is well-appearing.  No acute distress.  Troponins remain flat.  Heart score 4.  I gave the patient the option of admission and monitoring, but he declines.  He will call his PCP in 2 days for close follow-up.  He will likely need outpatient stress testing.  We discussed strict ER return precautions. Low suspicion for ACS/dissection/PE at this time Final Clinical Impression(s) / ED Diagnoses Final diagnoses:  Precordial pain    Rx / DC  Orders ED Discharge Orders    None       Ripley Fraise, MD 11/09/19 [phone]

## 2020-07-01 IMAGING — CR DG CHEST 2V
2 series · 2 of 2 positions shown · non-contrast
Comparison: July 11, 2013

CLINICAL DATA: Chest pain

EXAM:
CHEST - 2 VIEW

[w chest pa]
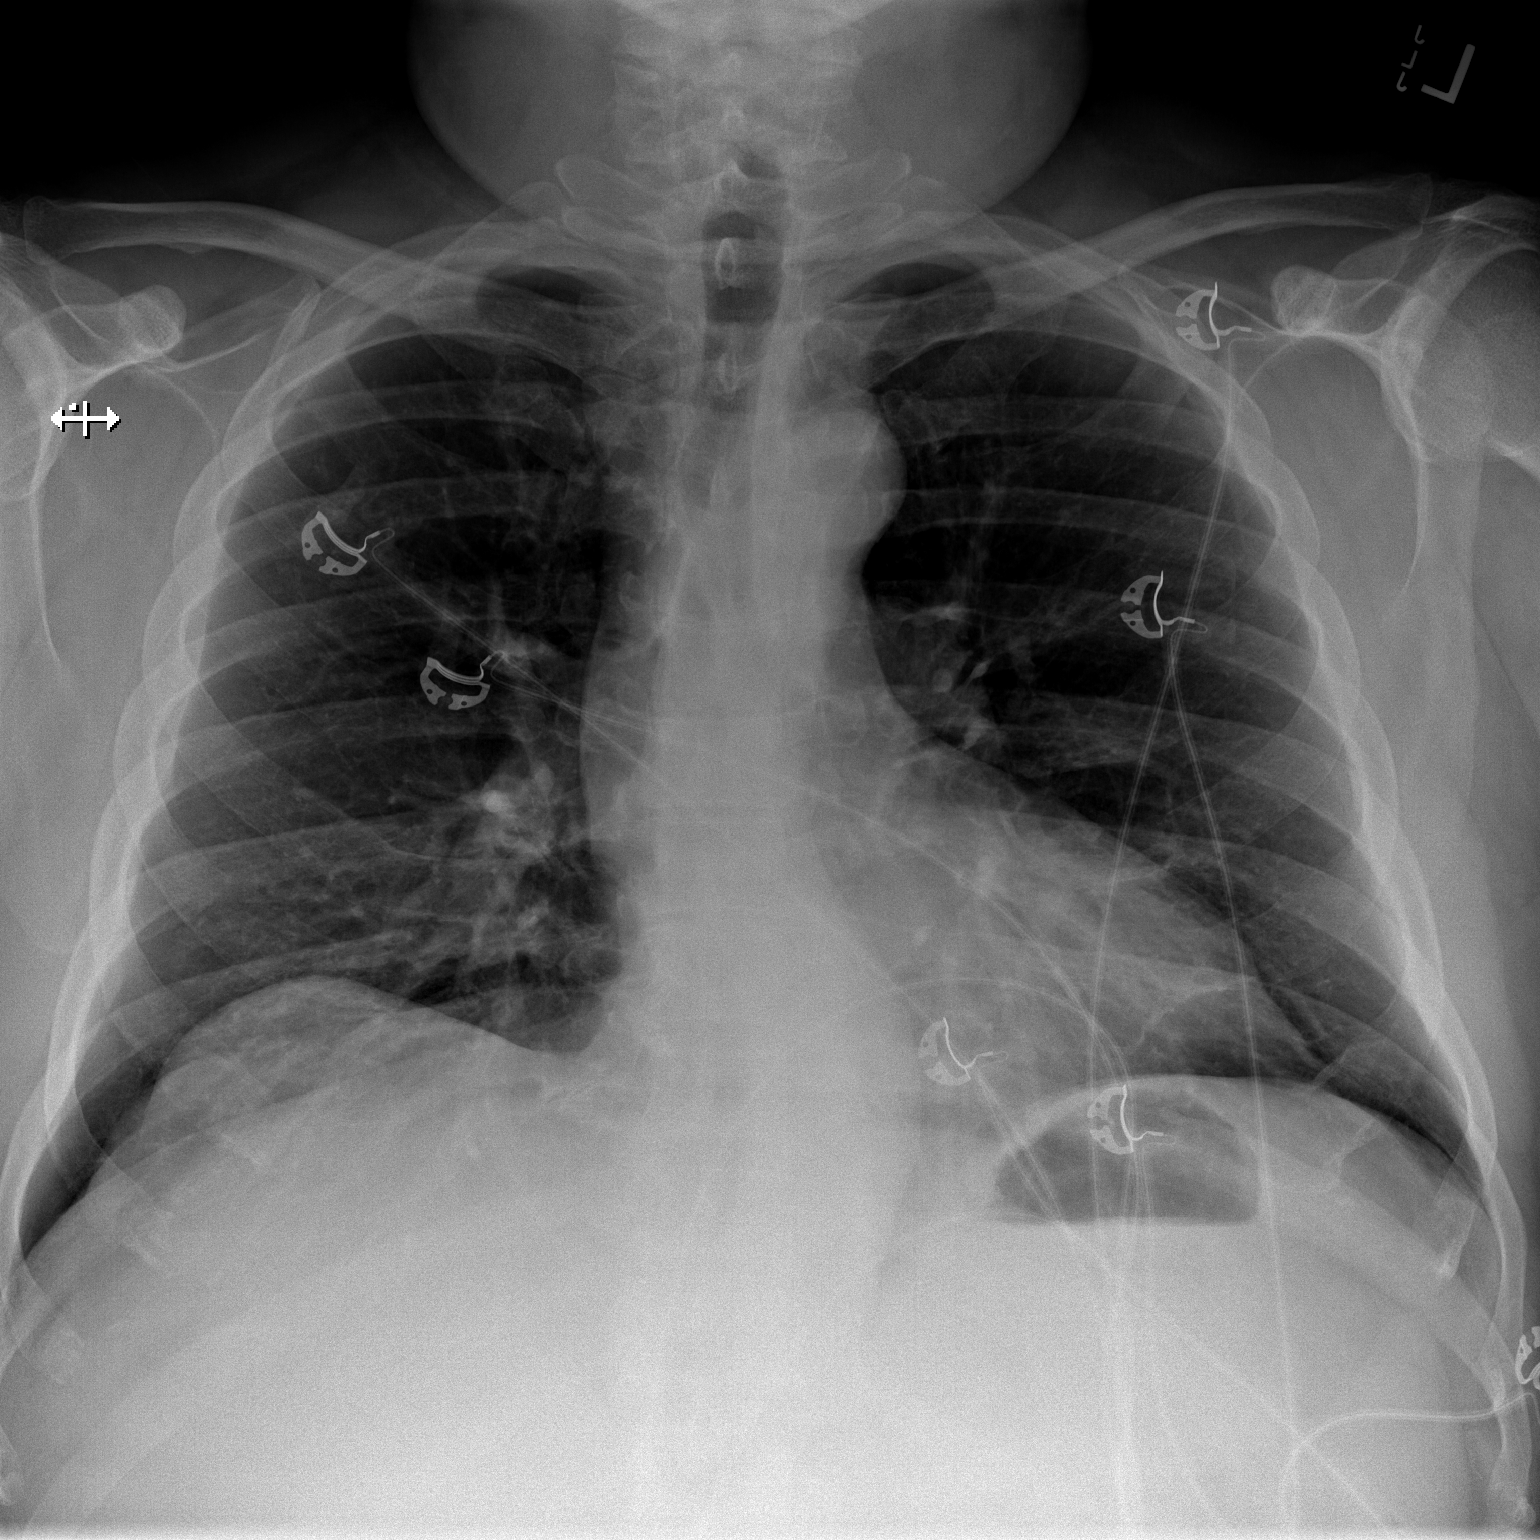

[w chest lat]
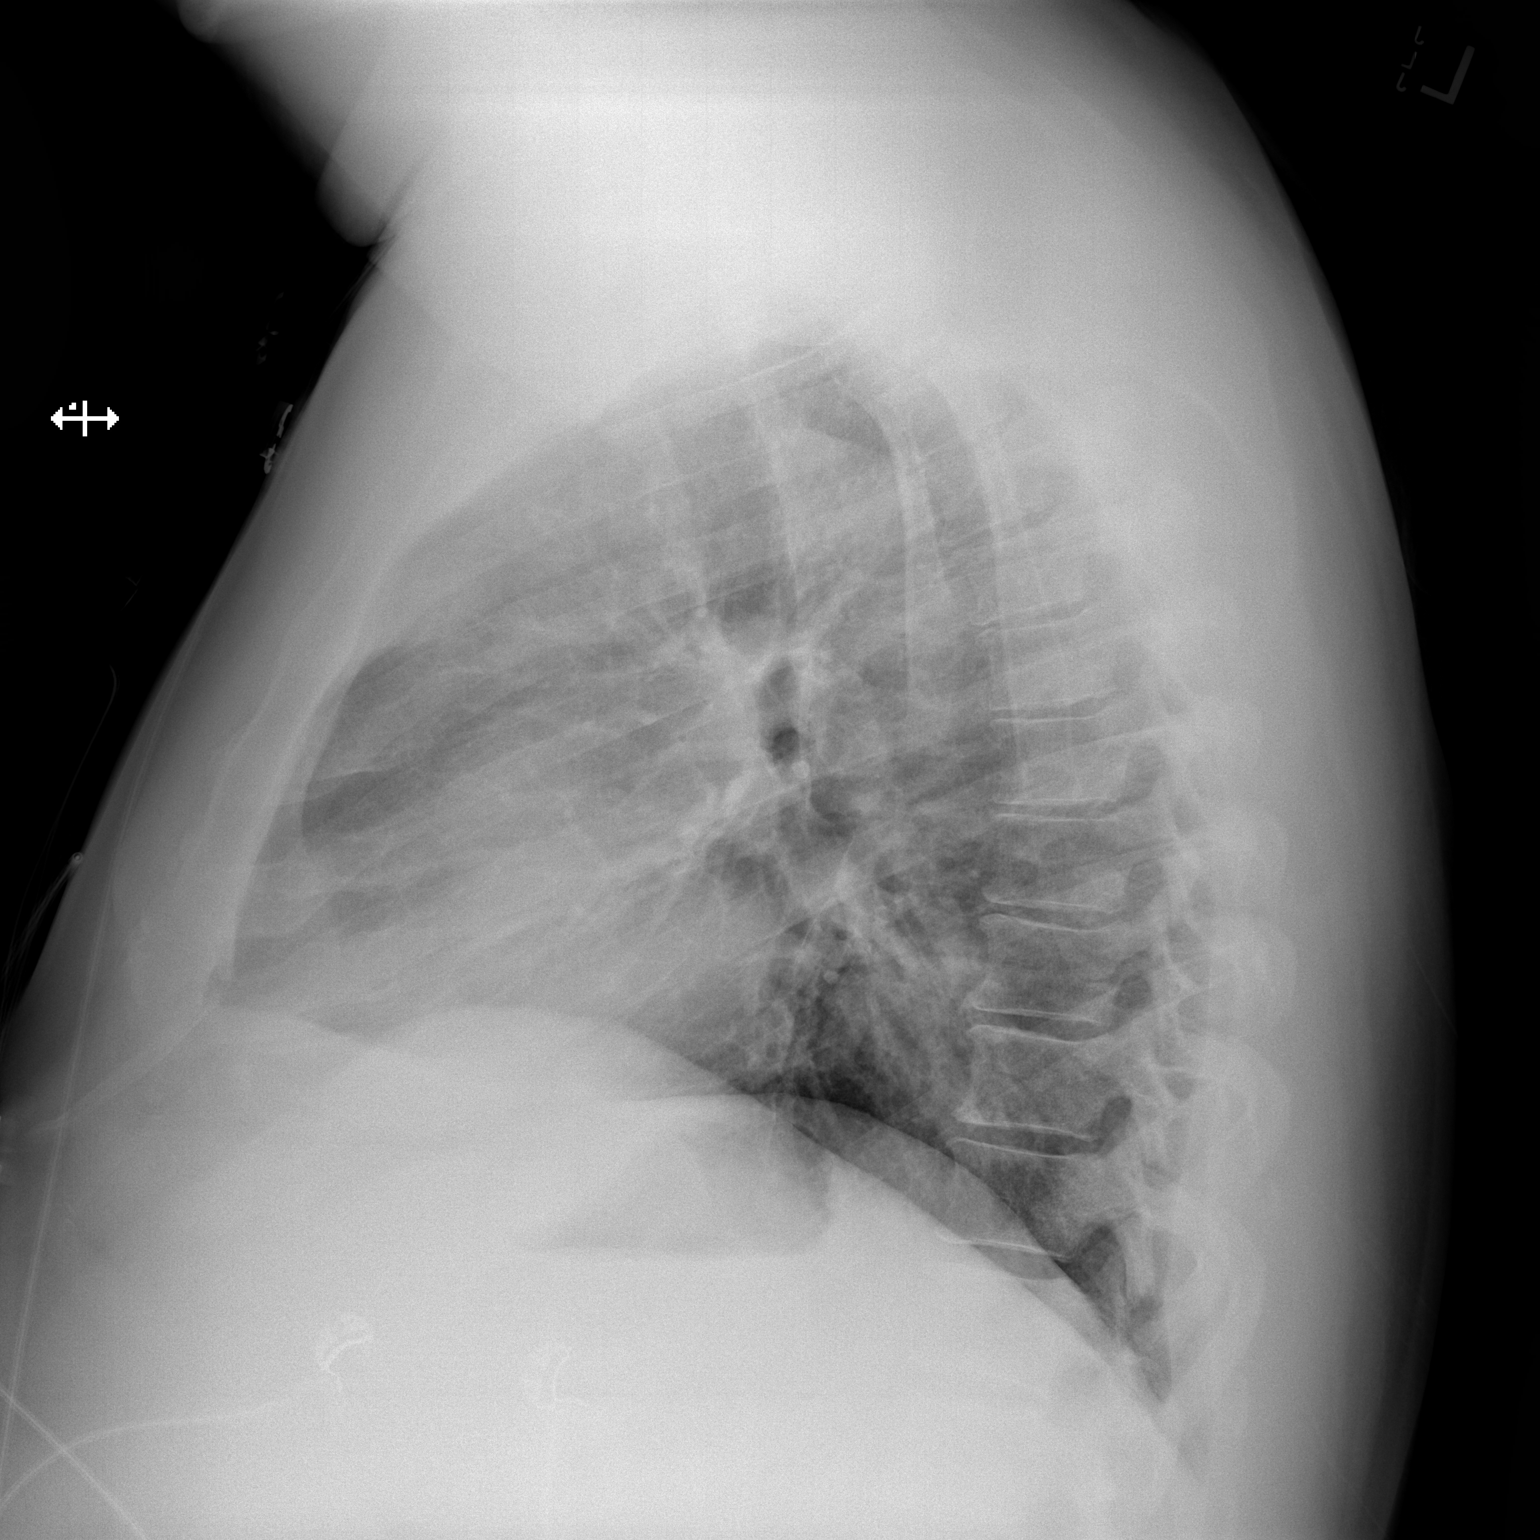

[2 of 2 positions shown; findings below may reference images not displayed]

FINDINGS: The heart size and mediastinal contours are within normal limits.
Tortuous descending aorta. Both lungs are clear. The visualized
skeletal structures are unremarkable.
IMPRESSION: No active cardiopulmonary disease.

## 2020-07-29 DIAGNOSIS — E119 Type 2 diabetes mellitus without complications: Secondary | ICD-10-CM | POA: Insufficient documentation

## 2022-09-16 ENCOUNTER — Encounter: Payer: Self-pay | Admitting: Physician Assistant

## 2022-10-17 ENCOUNTER — Encounter: Payer: Self-pay | Admitting: Physician Assistant

## 2022-10-17 ENCOUNTER — Ambulatory Visit (INDEPENDENT_AMBULATORY_CARE_PROVIDER_SITE_OTHER): Payer: PRIVATE HEALTH INSURANCE | Admitting: Physician Assistant

## 2022-10-17 ENCOUNTER — Other Ambulatory Visit (INDEPENDENT_AMBULATORY_CARE_PROVIDER_SITE_OTHER): Payer: PRIVATE HEALTH INSURANCE

## 2022-10-17 VITALS — BP 122/80 | HR 65 | Ht 72.0 in | Wt 273.0 lb

## 2022-10-17 DIAGNOSIS — R110 Nausea: Secondary | ICD-10-CM

## 2022-10-17 DIAGNOSIS — R1084 Generalized abdominal pain: Secondary | ICD-10-CM

## 2022-10-17 DIAGNOSIS — E1169 Type 2 diabetes mellitus with other specified complication: Secondary | ICD-10-CM

## 2022-10-17 LAB — CBC
HCT: 46.2 % (ref 39.0–52.0)
Hemoglobin: 15.7 g/dL (ref 13.0–17.0)
MCHC: 34 g/dL (ref 30.0–36.0)
MCV: 86.9 fl (ref 78.0–100.0)
Platelets: 266 10*3/uL (ref 150.0–400.0)
RBC: 5.32 Mil/uL (ref 4.22–5.81)
RDW: 13.5 % (ref 11.5–15.5)
WBC: 8 10*3/uL (ref 4.0–10.5)

## 2022-10-17 LAB — COMPREHENSIVE METABOLIC PANEL
ALT: 32 U/L (ref 0–53)
AST: 15 U/L (ref 0–37)
Albumin: 4 g/dL (ref 3.5–5.2)
Alkaline Phosphatase: 68 U/L (ref 39–117)
BUN: 20 mg/dL (ref 6–23)
CO2: 22 mEq/L (ref 19–32)
Calcium: 9.3 mg/dL (ref 8.4–10.5)
Chloride: 106 mEq/L (ref 96–112)
Creatinine, Ser: 0.89 mg/dL (ref 0.40–1.50)
GFR: 94.68 mL/min (ref >60.00)
Glucose, Bld: 187 mg/dL — ABNORMAL HIGH (ref 70–99)
Potassium: 3.8 mEq/L (ref 3.5–5.1)
Sodium: 137 mEq/L (ref 135–145)
Total Bilirubin: 0.5 mg/dL (ref 0.2–1.2)
Total Protein: 7 g/dL (ref 6.0–8.3)

## 2022-10-17 LAB — SEDIMENTATION RATE: Sed Rate: 16 mm/hr (ref 0–20)

## 2022-10-17 NOTE — Progress Notes (Signed)
Agree with assessment and plan as outlined. If symptoms persist consider cross sectional imaging.

## 2022-10-17 NOTE — Progress Notes (Signed)
10/17/2022 SHLOME GARRIDO [id number] [birth year]  Referring provider: Chesley Noon, MD [facility]) Primary GI doctor: Dr. Havery Moros  ASSESSMENT AND PLAN:   Generalized abdominal pain  In the last 8 months, intermittent every 3 to 4 months, can last 3 days to 3 weeks with most recent episode.  Rarely associate with nausea, has had more constipation the patient states not associated with abnormal change in stools. Recent addition of nortriptyline Patient with very reassuring abdominal examination, no abdominal pain today in the office. Will check labs looking for CBC, c-Met, celiac, sed rate. Could be gas/constipation related with addition of nortriptyline within last year and symptoms starting within the last 8 months. Consider x-rays/cross-sectional imaging when patient is having abdominal pain again or if any abnormality in labs. Patient is can be starting on Mounjaro for his diabetes, mentioned going ahead and starting MiraLAX  to prevent worsening constiptaion Patient declined scheduling follow-up, but will call if pain reoccurs.   Recall colonoscopy 2027   Patient Care Team: Chesley Noon, MD as PCP - General (Family Medicine)  HISTORY OF PRESENT ILLNESS: 57 y.o. male with a past medical history of HTN, obesity, OSA, type 2 DM, subclinical hyperthyroidism,  on CPAP, and others listed below presents for evaluation of AB cramping.   02/2016 colonoscopy good prep showed 3 mm hyperplastic polyp rectum, diverticulosis, internal hemorrhoids, recall 10 years. 02/2026 No AB imaging  He will have AB pain, can be constant and last 2-3 weeks, and then will not happen for 3-4 months, started within the last 8 months.  Cramping bilateral sides of umbilicus, no radiation, no movement.  Movement or leaning while sitting can improve it. No nocturnal symptoms. Not associated with food.  He has BM daily but can go a few days without BM, he is not on miralax.  Was started on  nortriptyline and topamax in the last year.  Not associated with change in stools, no fever, chills.  Can be associated with rare nausea without vomiting, can have nausea in the morning randomly.  No melena, no hematochezia.  He has GERD occ, takes tums.  No bloating/no gas.  Aleve rare for headache. Rare ETOH.   He is currently on glipizide and synjarney and being put on monjaro.    He  reports that he has never smoked. He has never used smokeless tobacco. He reports current alcohol use. He reports that he does not use drugs.  RELEVANT LABS AND IMAGING: CBC    Component Value Date/Time   WBC 9.1 11/09/2019 0003   RBC 5.16 11/09/2019 0003   HGB 15.5 11/09/2019 0003   HCT 46.3 11/09/2019 0003   PLT 274 11/09/2019 0003   MCV 89.7 11/09/2019 0003   MCH 30.0 11/09/2019 0003   MCHC 33.5 11/09/2019 0003   RDW 12.9 11/09/2019 0003    CMP     Component Value Date/Time   NA 141 11/09/2019 0003   K 4.4 11/09/2019 0003   CL 105 11/09/2019 0003   CO2 28 11/09/2019 0003   GLUCOSE 213 (H) 11/09/2019 0003   BUN 21 (H) 11/09/2019 0003   CREATININE 1.21 11/09/2019 0003   CALCIUM 9.4 11/09/2019 0003   GFRNONAA >60 11/09/2019 0003   GFRAA >60 11/09/2019 0003       No data to display            Current Medications:    Current Outpatient Medications (Cardiovascular):    carvedilol (COREG) 12.5 MG tablet, Take 12.5 mg  by mouth 2 (two) times daily with a meal.   losartan-hydrochlorothiazide (HYZAAR) 100-25 MG tablet, Take 1 tablet by mouth daily.   Current Outpatient Medications (Analgesics):    acetaminophen (TYLENOL) 325 MG tablet, Take by mouth.   aspirin 81 MG chewable tablet, Chew by mouth.   ibuprofen (ADVIL,MOTRIN) 800 MG tablet, Take 1 tablet (800 mg total) by mouth 3 (three) times daily.   oxyCODONE (OXY IR/ROXICODONE) 5 MG immediate release tablet, TAKE ONE TABLET BY MOUTH EVERY 4 HOURS AS NEEDED FOR PAIN FOR UP TO 10 DAYS. (Patient not taking: Reported on  10/17/2022)   Current Outpatient Medications (Other):    nortriptyline (PAMELOR) 50 MG capsule, Take 50 mg by mouth at bedtime.   topiramate (TOPAMAX) 25 MG tablet, Take 25 mg by mouth daily.   methocarbamol (ROBAXIN) 500 MG tablet, TAKE ONE TABLET BY MOUTH 3 (THREE) TIMES A DAY AS NEEDED. (Patient not taking: Reported on 10/17/2022)   polyethylene glycol (MIRALAX / GLYCOLAX) packet, Take by mouth. (Patient not taking: Reported on 10/17/2022)  Medical History:  Past Medical History:  Diagnosis Date   Arthritis    left hip   Heart murmur 1986   Hypertension    Sleep apnea    CPAP   Allergies:  Allergies  Allergen Reactions   Penicillins Other (See Comments) and Rash    Childhood reaction Other reaction(s): Unknown Childhood reaction Childhood reaction      Surgical History:  He  has a past surgical history that includes Wisdom tooth extraction (08/15/1984) and Hip Arthroplasty. Family History:  His family history includes Colon polyps in his father; Diabetes in his maternal grandmother and mother; High blood pressure in his father; Prostate cancer in his father.  REVIEW OF SYSTEMS  : All other systems reviewed and negative except where noted in the History of Present Illness.  PHYSICAL EXAM: BP 122/80   Pulse 65   Ht 6' (1.829 m)   Wt 273 lb (123.8 kg)   SpO2 98%   BMI 37.03 kg/m  General Appearance: Well nourished, in no apparent distress. Head:   Normocephalic and atraumatic. Eyes:  sclerae anicteric,conjunctive pink  Respiratory: Respiratory effort normal, BS equal bilaterally without rales, rhonchi, wheezing. Cardio: RRR with no MRGs. Peripheral pulses intact.  Abdomen: Soft,  Obese ,active bowel sounds. No tenderness . No masses. Rectal: Not evaluated Musculoskeletal: Full ROM, Normal gait. Without edema. Skin:  Dry and intact without significant lesions or rashes Neuro: Alert and  oriented x4;  No focal deficits. Psych:  Cooperative. Normal mood and affect.     Vladimir Crofts, PA-C 3:09 PM

## 2022-10-17 NOTE — Patient Instructions (Addendum)
Your provider has requested that you go to the basement level for lab work before leaving today. Press "B" on the elevator. The lab is located at the first door on the left as you exit the elevator.   Can do trial of IBGard which is over the counter for AB pain- Take 1-2 capsules once a day for maintence or twice a day during a flare If it comes back please call and we can get imaging.  Keep diary of bowel movements and pain to try to see when this happens.   Can do trial of miralax if constipated.  Monitor with starting the mounjaro.   Due for colon 02/2026  Please try low FODMAP diet- see below- start with eliminating just one column at a time, the table at the very bottom contains foods that are safe to take   FODMAP stands for fermentable oligo-, di-, mono-saccharides and polyols (1). These are the scientific terms used to classify groups of carbs that are notorious for triggering digestive symptoms like bloating, gas and stomach pain.

## 2022-10-18 LAB — IGA: Immunoglobulin A: 268 mg/dL (ref 47–310)

## 2022-10-18 LAB — TISSUE TRANSGLUTAMINASE, IGA: (tTG) Ab, IgA: <1 U/mL
